# Patient Record
Sex: Male | Born: 2000
Health system: Southern US, Community
[De-identification: ages and names within clinical notes are randomized; demographics above are authoritative.]

---
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
## 2018-02-20 DIAGNOSIS — J029 Acute pharyngitis, unspecified: Secondary | ICD-10-CM | POA: Diagnosis not present

## 2018-02-20 DIAGNOSIS — H66002 Acute suppurative otitis media without spontaneous rupture of ear drum, left ear: Secondary | ICD-10-CM | POA: Diagnosis not present

## 2018-02-20 DIAGNOSIS — J0100 Acute maxillary sinusitis, unspecified: Secondary | ICD-10-CM | POA: Diagnosis not present

## 2018-04-04 DIAGNOSIS — K760 Fatty (change of) liver, not elsewhere classified: Secondary | ICD-10-CM | POA: Diagnosis not present

## 2018-04-04 DIAGNOSIS — Z79899 Other long term (current) drug therapy: Secondary | ICD-10-CM | POA: Diagnosis not present

## 2018-04-04 DIAGNOSIS — Z7722 Contact with and (suspected) exposure to environmental tobacco smoke (acute) (chronic): Secondary | ICD-10-CM | POA: Diagnosis not present

## 2018-04-04 DIAGNOSIS — K353 Acute appendicitis with localized peritonitis, without perforation or gangrene: Secondary | ICD-10-CM | POA: Diagnosis not present

## 2018-04-04 DIAGNOSIS — F909 Attention-deficit hyperactivity disorder, unspecified type: Secondary | ICD-10-CM | POA: Diagnosis not present

## 2018-04-04 DIAGNOSIS — D72829 Elevated white blood cell count, unspecified: Secondary | ICD-10-CM | POA: Diagnosis not present

## 2018-04-04 DIAGNOSIS — K3580 Unspecified acute appendicitis: Secondary | ICD-10-CM | POA: Diagnosis not present

## 2018-04-04 DIAGNOSIS — Z791 Long term (current) use of non-steroidal anti-inflammatories (NSAID): Secondary | ICD-10-CM | POA: Diagnosis not present

## 2018-04-04 DIAGNOSIS — Z7952 Long term (current) use of systemic steroids: Secondary | ICD-10-CM | POA: Diagnosis not present

## 2018-04-04 DIAGNOSIS — R1031 Right lower quadrant pain: Secondary | ICD-10-CM | POA: Diagnosis not present

## 2018-04-05 DIAGNOSIS — K3580 Unspecified acute appendicitis: Secondary | ICD-10-CM | POA: Diagnosis not present

## 2018-04-05 DIAGNOSIS — K37 Unspecified appendicitis: Secondary | ICD-10-CM | POA: Diagnosis not present

## 2018-04-08 MED ORDER — GENERIC EXTERNAL MEDICATION
Status: DC
Start: ? — End: 2018-04-08

## 2018-04-08 MED ORDER — HYDROCODONE-ACETAMINOPHEN 5-325 MG PO TABS
1.00 | ORAL_TABLET | ORAL | Status: DC
Start: ? — End: 2018-04-08

## 2018-04-08 MED ORDER — LACTATED RINGERS IV SOLN
125.00 | INTRAVENOUS | Status: DC
Start: ? — End: 2018-04-08

## 2018-04-08 MED ORDER — KETOROLAC TROMETHAMINE 15 MG/ML IJ SOLN
15.00 | INTRAMUSCULAR | Status: DC
Start: 2018-04-06 — End: 2018-04-08

## 2018-04-08 MED ORDER — MORPHINE SULFATE (PF) 4 MG/ML IV SOLN
2.00 | INTRAVENOUS | Status: DC
Start: ? — End: 2018-04-08

## 2018-04-08 MED ORDER — SODIUM CHLORIDE 0.9 % IV SOLN
10.00 | INTRAVENOUS | Status: DC
Start: ? — End: 2018-04-08

## 2018-04-08 MED ORDER — DIPHENHYDRAMINE HCL 50 MG/ML IJ SOLN
25.00 | INTRAMUSCULAR | Status: DC
Start: ? — End: 2018-04-08

## 2018-04-08 MED ORDER — ENOXAPARIN SODIUM 40 MG/0.4ML ~~LOC~~ SOLN
40.00 | SUBCUTANEOUS | Status: DC
Start: 2018-04-07 — End: 2018-04-08

## 2018-04-08 MED ORDER — DIPHENHYDRAMINE HCL 25 MG PO CAPS
25.00 | ORAL_CAPSULE | ORAL | Status: DC
Start: ? — End: 2018-04-08

## 2018-04-08 MED ORDER — FAMOTIDINE 20 MG/2ML IV SOLN
20.00 | INTRAVENOUS | Status: DC
Start: 2018-04-06 — End: 2018-04-08

## 2018-05-03 DIAGNOSIS — R000 Tachycardia, unspecified: Secondary | ICD-10-CM | POA: Diagnosis not present

## 2018-05-03 DIAGNOSIS — S0081XA Abrasion of other part of head, initial encounter: Secondary | ICD-10-CM | POA: Diagnosis not present

## 2018-05-03 DIAGNOSIS — Z041 Encounter for examination and observation following transport accident: Secondary | ICD-10-CM | POA: Diagnosis not present

## 2018-05-03 DIAGNOSIS — R1013 Epigastric pain: Secondary | ICD-10-CM | POA: Diagnosis not present

## 2018-05-03 DIAGNOSIS — Z23 Encounter for immunization: Secondary | ICD-10-CM | POA: Diagnosis not present

## 2018-05-03 DIAGNOSIS — M791 Myalgia, unspecified site: Secondary | ICD-10-CM | POA: Diagnosis not present

## 2018-05-03 DIAGNOSIS — R109 Unspecified abdominal pain: Secondary | ICD-10-CM | POA: Diagnosis not present

## 2018-05-03 DIAGNOSIS — Y92410 Unspecified street and highway as the place of occurrence of the external cause: Secondary | ICD-10-CM | POA: Diagnosis not present

## 2018-05-03 DIAGNOSIS — R079 Chest pain, unspecified: Secondary | ICD-10-CM | POA: Diagnosis not present

## 2018-05-03 DIAGNOSIS — M542 Cervicalgia: Secondary | ICD-10-CM | POA: Diagnosis not present

## 2018-05-03 DIAGNOSIS — M545 Low back pain: Secondary | ICD-10-CM | POA: Diagnosis not present

## 2018-05-03 DIAGNOSIS — M25522 Pain in left elbow: Secondary | ICD-10-CM | POA: Diagnosis not present

## 2018-05-03 DIAGNOSIS — Y999 Unspecified external cause status: Secondary | ICD-10-CM | POA: Diagnosis not present

## 2018-05-05 ENCOUNTER — Encounter: Payer: Self-pay | Admitting: Sports Medicine

## 2018-05-05 ENCOUNTER — Ambulatory Visit (INDEPENDENT_AMBULATORY_CARE_PROVIDER_SITE_OTHER): Payer: BLUE CROSS/BLUE SHIELD

## 2018-05-05 ENCOUNTER — Ambulatory Visit (INDEPENDENT_AMBULATORY_CARE_PROVIDER_SITE_OTHER): Payer: BLUE CROSS/BLUE SHIELD | Admitting: Sports Medicine

## 2018-05-05 DIAGNOSIS — M25522 Pain in left elbow: Secondary | ICD-10-CM

## 2018-05-05 DIAGNOSIS — F419 Anxiety disorder, unspecified: Secondary | ICD-10-CM

## 2018-05-05 DIAGNOSIS — K769 Liver disease, unspecified: Secondary | ICD-10-CM | POA: Insufficient documentation

## 2018-05-05 DIAGNOSIS — S59902A Unspecified injury of left elbow, initial encounter: Secondary | ICD-10-CM | POA: Diagnosis not present

## 2018-05-05 DIAGNOSIS — F329 Major depressive disorder, single episode, unspecified: Secondary | ICD-10-CM | POA: Diagnosis not present

## 2018-05-05 DIAGNOSIS — F32A Depression, unspecified: Secondary | ICD-10-CM

## 2018-05-05 MED ORDER — TRAMADOL HCL 50 MG PO TABS: 50.0000 mg | ORAL_TABLET | Freq: Three times a day (TID) | ORAL | 0 refills | Status: DC | PRN

## 2018-05-05 MED ORDER — PREDNISONE 50 MG PO TABS: ORAL_TABLET | ORAL | 0 refills | Status: DC

## 2018-05-05 MED ORDER — ESCITALOPRAM OXALATE 10 MG PO TABS: 10.0000 mg | ORAL_TABLET | Freq: Every day | ORAL | 3 refills | Status: DC

## 2018-05-05 MED ORDER — CYCLOBENZAPRINE HCL 10 MG PO TABS
ORAL_TABLET | ORAL | 0 refills | Status: DC
Start: 2018-05-05 — End: 2022-06-06

## 2018-05-05 NOTE — Assessment & Plan Note (Signed)
Self discontinued Lexapro, restarting at 10 mg. Return in 1 month for PHQ and GAD.

## 2018-05-05 NOTE — Assessment & Plan Note (Signed)
Transaminitis to the 400s, lesion seen on CT scan suspicious for hemangiomas. Rechecking CBC, CMP. He does need a liver MRI with and without contrast.
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
## 2018-05-06 ENCOUNTER — Telehealth: Payer: Self-pay | Admitting: Sports Medicine

## 2018-05-06 LAB — COMPREHENSIVE METABOLIC PANEL
ALT: 164 U/L — ABNORMAL HIGH (ref 8–46)
AST: 50 U/L — ABNORMAL HIGH (ref 12–32)
Alkaline phosphatase (APISO): 82 U/L (ref 48–230)
CO2: 28 mmol/L (ref 20–32)
Chloride: 105 mmol/L (ref 98–110)
Creat: 0.83 mg/dL (ref 0.60–1.26)
Globulin: 2.7 g/dL (calc) (ref 2.1–3.5)
Glucose, Bld: 91 mg/dL (ref 65–99)
Total Bilirubin: 0.6 mg/dL (ref 0.2–1.1)
Total Protein: 7.1 g/dL (ref 6.3–8.2)

## 2018-05-06 LAB — CBC
HCT: 42.1 % (ref 36.0–49.0)
Hemoglobin: 14 g/dL (ref 12.0–16.9)
MCH: 28.2 pg (ref 25.0–35.0)
MCHC: 33.3 g/dL (ref 31.0–36.0)
MCV: 84.7 fL (ref 78.0–98.0)
MPV: 11 fL (ref 7.5–12.5)
Platelets: 286 10*3/uL (ref 140–400)
RBC: 4.97 10*6/uL (ref 4.10–5.70)
RDW: 14.2 % (ref 11.0–15.0)
WBC: 8.4 10*3/uL (ref 4.5–13.0)

## 2018-05-06 LAB — COMPREHENSIVE METABOLIC PANEL WITH GFR
AG Ratio: 1.6 (calc) (ref 1.0–2.5)
Albumin: 4.4 g/dL (ref 3.6–5.1)
BUN: 10 mg/dL (ref 7–20)
Calcium: 9.8 mg/dL (ref 8.9–10.4)
Potassium: 4.2 mmol/L (ref 3.8–5.1)
Sodium: 141 mmol/L (ref 135–146)

## 2018-05-06 MED ORDER — ONDANSETRON 8 MG PO TBDP: 8.0000 mg | ORAL_TABLET | Freq: Three times a day (TID) | ORAL | 3 refills | Status: AC | PRN

## 2018-05-06 NOTE — Telephone Encounter (Signed)
Zofran called in.  Is there are significant anxiety with the nausea?
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
## 2018-05-19 ENCOUNTER — Encounter: Payer: Self-pay | Admitting: Sports Medicine

## 2018-05-19 ENCOUNTER — Ambulatory Visit (INDEPENDENT_AMBULATORY_CARE_PROVIDER_SITE_OTHER): Payer: BLUE CROSS/BLUE SHIELD | Admitting: Sports Medicine

## 2018-05-19 VITALS — BP 115/77 | HR 93 | Ht 69.5 in | Wt 240.0 lb

## 2018-05-19 DIAGNOSIS — F419 Anxiety disorder, unspecified: Secondary | ICD-10-CM | POA: Diagnosis not present

## 2018-05-19 DIAGNOSIS — R0789 Other chest pain: Secondary | ICD-10-CM | POA: Diagnosis not present

## 2018-05-19 DIAGNOSIS — Z23 Encounter for immunization: Secondary | ICD-10-CM | POA: Diagnosis not present

## 2018-05-19 DIAGNOSIS — F329 Major depressive disorder, single episode, unspecified: Secondary | ICD-10-CM

## 2018-05-19 DIAGNOSIS — K769 Liver disease, unspecified: Secondary | ICD-10-CM

## 2018-05-19 DIAGNOSIS — E669 Obesity, unspecified: Secondary | ICD-10-CM

## 2018-05-19 DIAGNOSIS — F32A Depression, unspecified: Secondary | ICD-10-CM

## 2018-05-19 LAB — COMPREHENSIVE METABOLIC PANEL
AG Ratio: 1.7 (calc) (ref 1.0–2.5)
AST: 40 U/L — ABNORMAL HIGH (ref 12–32)
CO2: 29 mmol/L (ref 20–32)
Chloride: 101 mmol/L (ref 98–110)
Globulin: 2.6 g/dL (calc) (ref 2.1–3.5)
Glucose, Bld: 84 mg/dL (ref 65–99)
Sodium: 139 mmol/L (ref 135–146)
Total Protein: 7 g/dL (ref 6.3–8.2)

## 2018-05-19 LAB — COMPREHENSIVE METABOLIC PANEL WITH GFR
ALT: 78 U/L — ABNORMAL HIGH (ref 8–46)
Albumin: 4.4 g/dL (ref 3.6–5.1)
Alkaline phosphatase (APISO): 83 U/L (ref 48–230)
BUN: 12 mg/dL (ref 7–20)
Calcium: 9.9 mg/dL (ref 8.9–10.4)
Creat: 1.19 mg/dL (ref 0.60–1.26)
Potassium: 4.5 mmol/L (ref 3.8–5.1)
Total Bilirubin: 0.5 mg/dL (ref 0.2–1.1)

## 2018-05-19 MED ORDER — PREDNISONE 50 MG PO TABS: 50.0000 mg | ORAL_TABLET | Freq: Every day | ORAL | 0 refills | Status: DC

## 2018-05-19 MED ORDER — ESCITALOPRAM OXALATE 20 MG PO TABS: 20.0000 mg | ORAL_TABLET | Freq: Every day | ORAL | 3 refills | Status: DC

## 2018-05-19 MED ORDER — AZITHROMYCIN 250 MG PO TABS: ORAL_TABLET | ORAL | 0 refills | Status: DC
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
## 2018-05-19 NOTE — Assessment & Plan Note (Signed)
Chest CT in the emergency department did show some groundglass opacities suspicious for pneumonia. Considering he still having some pain with deep breathing we are going to add a repeat chest x-ray, prednisone and azithromycin.
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
## 2018-05-19 NOTE — Assessment & Plan Note (Signed)
MRI shows what appear to be hemangiomas which are benign. Rechecking LFTs. He did have hepatic steatosis, needs to lose a lot of weight.

## 2018-05-19 NOTE — Assessment & Plan Note (Signed)
Persistent pain all over, headaches, nausea, depression and anxiety is still uncontrolled. Increasing Lexapro to 20 mg.

## 2018-05-19 NOTE — Assessment & Plan Note (Signed)
We will restart phentermine at the next visit.

## 2018-05-21 ENCOUNTER — Ambulatory Visit (INDEPENDENT_AMBULATORY_CARE_PROVIDER_SITE_OTHER): Payer: BLUE CROSS/BLUE SHIELD

## 2018-05-21 DIAGNOSIS — R079 Chest pain, unspecified: Secondary | ICD-10-CM

## 2018-05-21 DIAGNOSIS — R0789 Other chest pain: Secondary | ICD-10-CM

## 2018-06-09 ENCOUNTER — Ambulatory Visit (INDEPENDENT_AMBULATORY_CARE_PROVIDER_SITE_OTHER): Payer: BLUE CROSS/BLUE SHIELD | Admitting: Sports Medicine

## 2018-06-09 ENCOUNTER — Encounter: Payer: Self-pay | Admitting: Sports Medicine

## 2018-06-09 DIAGNOSIS — F419 Anxiety disorder, unspecified: Secondary | ICD-10-CM | POA: Diagnosis not present

## 2018-06-09 DIAGNOSIS — E669 Obesity, unspecified: Secondary | ICD-10-CM

## 2018-06-09 DIAGNOSIS — F329 Major depressive disorder, single episode, unspecified: Secondary | ICD-10-CM

## 2018-06-09 DIAGNOSIS — F32A Depression, unspecified: Secondary | ICD-10-CM

## 2018-06-09 DIAGNOSIS — R0789 Other chest pain: Secondary | ICD-10-CM | POA: Diagnosis not present

## 2018-06-09 MED ORDER — PHENTERMINE HCL 37.5 MG PO TABS: ORAL_TABLET | ORAL | 0 refills | Status: DC
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
## 2018-06-09 NOTE — Assessment & Plan Note (Signed)
Only minimal upper abdominal/lower chest wall pain, we did prednisone and azithromycin at the last visit as his CT did have some groundglass opacities. Symptoms are mostly gone, chest x-ray at the last visit was clear, no further imaging needed.

## 2018-06-09 NOTE — Assessment & Plan Note (Signed)
Symptoms not significantly improved on 20 mg of Lexapro. Adding behavioral therapy. We are going to be treating his weight with phentermine so I am not going to add Abilify at this time but if persistent depressive symptoms and no improvement in scores at the next visit we will add Abilify 5.

## 2018-06-09 NOTE — Assessment & Plan Note (Signed)
With hepatic steatosis and transaminitis. Restarting phentermine, return monthly for weight checks and refills.

## 2018-06-16 ENCOUNTER — Ambulatory Visit: Payer: BLUE CROSS/BLUE SHIELD | Admitting: Sports Medicine

## 2018-07-07 ENCOUNTER — Ambulatory Visit (INDEPENDENT_AMBULATORY_CARE_PROVIDER_SITE_OTHER): Payer: BLUE CROSS/BLUE SHIELD | Admitting: Sports Medicine

## 2018-07-07 ENCOUNTER — Encounter: Payer: Self-pay | Admitting: Sports Medicine

## 2018-07-07 DIAGNOSIS — F419 Anxiety disorder, unspecified: Secondary | ICD-10-CM

## 2018-07-07 DIAGNOSIS — F329 Major depressive disorder, single episode, unspecified: Secondary | ICD-10-CM

## 2018-07-07 DIAGNOSIS — F32A Depression, unspecified: Secondary | ICD-10-CM

## 2018-07-07 DIAGNOSIS — E669 Obesity, unspecified: Secondary | ICD-10-CM | POA: Diagnosis not present

## 2018-07-07 MED ORDER — PHENTERMINE HCL 37.5 MG PO TABS: ORAL_TABLET | ORAL | 0 refills | Status: DC

## 2018-07-07 NOTE — Assessment & Plan Note (Signed)
For the most part noncompliant with phentermine over the past month. He does have hepatic steatosis and transaminitis. Refilling phentermine, entering the second month, he will take it consistently, with a target of 5 to 7 pounds weight loss over the next month. No adverse effects or intolerance to phentermine.

## 2018-07-07 NOTE — Assessment & Plan Note (Signed)
Slight improvement with the addition of phentermine to the regimen. He is also working now. Continue Lexapro 20. We may add Topamax at the next visit should depressive symptoms still be severe and/or weight be plateaued. Abilify always is an option as well.

## 2018-07-07 NOTE — Progress Notes (Signed)
Subjective:    CC: Follow-up  HPI: Obesity: Only a 3 pound weight loss but has been only minimally compliant with the phentermine.  No adverse effects.  I reviewed the past medical history, family history, social history, surgical history, and allergies today and no changes were needed.  Please see the problem list section below in epic for further details.  Past Medical History: No past medical history on file. Past Surgical History: Past Surgical History:  Procedure Laterality Date  . TONSILLECTOMY AND ADENOIDECTOMY  2005   Social History: Social History   Socioeconomic History  . Marital status: Single    Spouse name: Not on file  . Number of children: Not on file  . Years of education: Not on file  . Highest education level: Not on file  Occupational History  . Not on file  Social Needs  . Financial resource strain: Not on file  . Food insecurity:    Worry: Not on file    Inability: Not on file  . Transportation needs:    Medical: Not on file    Non-medical: Not on file  Tobacco Use  . Smoking status: Never Smoker  . Smokeless tobacco: Never Used  Substance and Sexual Activity  . Alcohol use: No  . Drug use: No  . Sexual activity: Never  Lifestyle  . Physical activity:    Days per week: Not on file    Minutes per session: Not on file  . Stress: Not on file  Relationships  . Social connections:    Talks on phone: Not on file    Gets together: Not on file    Attends religious service: Not on file    Active member of club or organization: Not on file    Attends meetings of clubs or organizations: Not on file    Relationship status: Not on file  Other Topics Concern  . Not on file  Social History Narrative  . Not on file   Family History: Family History  Problem Relation Age of Onset  . Depression Mother   . Hypertension Father   . Cancer Maternal Aunt        colon  . Diabetes Paternal Uncle   . Hyperlipidemia Maternal Grandmother   .  Hypertension Maternal Grandmother   . Hyperlipidemia Maternal Grandfather   . Diabetes Paternal Grandmother    Allergies: No Known Allergies Medications: See med rec.  Review of Systems: No fevers, chills, night sweats, weight loss, chest pain, or shortness of breath.   Objective:    General: Well Developed, well nourished, and in no acute distress.  Neuro: Alert and oriented x3, extra-ocular muscles intact, sensation grossly intact.  HEENT: Normocephalic, atraumatic, pupils equal round reactive to light, neck supple, no masses, no lymphadenopathy, thyroid nonpalpable.  Skin: Warm and dry, no rashes. Cardiac: Regular rate and rhythm, no murmurs rubs or gallops, no lower extremity edema.  Respiratory: Clear to auscultation bilaterally. Not using accessory muscles, speaking in full sentences.  Impression and Recommendations:    Anxiety and depression Slight improvement with the addition of phentermine to the regimen. He is also working now. Continue Lexapro 20. We may add Topamax at the next visit should depressive symptoms still be severe and/or weight be plateaued. Abilify always is an option as well.  Obesity (BMI 30-39.9) For the most part noncompliant with phentermine over the past month. He does have hepatic steatosis and transaminitis. Refilling phentermine, entering the second month, he will take it consistently, with a  target of 5 to 7 pounds weight loss over the next month. No adverse effects or intolerance to phentermine. ___________________________________________ Ihor Austin. Benjamin Stain, M.D., ABFM., CAQSM. Primary Care and Sports Medicine [facility]  Adjunct Professor of Family Medicine  [employer] [facility]

## 2018-08-07 ENCOUNTER — Ambulatory Visit: Payer: BLUE CROSS/BLUE SHIELD | Admitting: Sports Medicine

## 2018-08-10 ENCOUNTER — Other Ambulatory Visit: Payer: Self-pay | Admitting: Sports Medicine

## 2018-08-10 DIAGNOSIS — F329 Major depressive disorder, single episode, unspecified: Secondary | ICD-10-CM

## 2018-08-10 DIAGNOSIS — F419 Anxiety disorder, unspecified: Principal | ICD-10-CM

## 2018-10-30 ENCOUNTER — Ambulatory Visit: Payer: BLUE CROSS/BLUE SHIELD | Admitting: Sports Medicine

## 2018-12-02 DIAGNOSIS — M545 Low back pain: Secondary | ICD-10-CM | POA: Diagnosis not present

## 2019-01-12 ENCOUNTER — Ambulatory Visit: Payer: BLUE CROSS/BLUE SHIELD | Admitting: Sports Medicine
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
## 2020-09-01 ENCOUNTER — Ambulatory Visit: Payer: Self-pay | Admitting: Sports Medicine

## 2020-09-12 DIAGNOSIS — Z20822 Contact with and (suspected) exposure to covid-19: Secondary | ICD-10-CM | POA: Diagnosis not present

## 2022-06-06 ENCOUNTER — Encounter: Payer: Self-pay | Admitting: Sports Medicine

## 2022-06-06 ENCOUNTER — Ambulatory Visit (INDEPENDENT_AMBULATORY_CARE_PROVIDER_SITE_OTHER): Payer: BC Managed Care – PPO | Admitting: Sports Medicine

## 2022-06-06 DIAGNOSIS — E669 Obesity, unspecified: Secondary | ICD-10-CM | POA: Diagnosis not present

## 2022-06-06 DIAGNOSIS — Z0000 Encounter for general adult medical examination without abnormal findings: Secondary | ICD-10-CM

## 2022-06-06 DIAGNOSIS — F5101 Primary insomnia: Secondary | ICD-10-CM

## 2022-06-06 MED ORDER — TRAZODONE HCL 50 MG PO TABS: ORAL_TABLET | ORAL | 3 refills | Status: AC

## 2022-06-06 MED ORDER — WEGOVY 0.25 MG/0.5ML ~~LOC~~ SOAJ: 0.2500 mg | SUBCUTANEOUS | 0 refills | Status: DC

## 2022-06-06 NOTE — Progress Notes (Signed)
    Procedures performed today:    None.  Independent interpretation of notes and tests performed by another provider:   None.  Brief History, Exam, Impression, and Recommendations:    Primary insomnia Yechezkel returns, he is a very pleasant 21 year old male, he is dealing with some primary insomnia, sleep latency for several hours. His sleep hygiene could use a bit of work, we discussed decreasing caffeinated beverages at dinner, we also discussed turning on the bluelight filter and decreasing the brightness of his cell phone before bed. We will also add trazodone in an up taper. Return to see me in 6 weeks.  Obesity (BMI 30-39.9) Keymon has done great losing over 20 pounds on his own, he has had a plateau, he will be part of a multidisciplinary weight loss program, with calorie counting, exercise prescription, starting PPIRJJ.  Annual physical exam After we get him stabilized with sleep and weight loss we will do an annual physical and check some routine labs.  Chronic process with exacerbation and pharmacologic intervention  ____________________________________________ Ihor Austin. Benjamin Stain, M.D., ABFM., CAQSM., AME. Primary Care and Sports Medicine [facility]  Adjunct Professor of Family Medicine  [place] [employer] [facility] [employer] [facility]  Restaurant manager, fast food

## 2022-06-06 NOTE — Assessment & Plan Note (Signed)
After we get him stabilized with sleep and weight loss we will do an annual physical and check some routine labs.

## 2022-06-06 NOTE — Assessment & Plan Note (Signed)
Riley Alvarez returns, he is a very pleasant 21 year old male, he is dealing with some primary insomnia, sleep latency for several hours. His sleep hygiene could use a bit of work, we discussed decreasing caffeinated beverages at dinner, we also discussed turning on the bluelight filter and decreasing the brightness of his cell phone before bed. We will also add trazodone in an up taper. Return to see me in 6 weeks.

## 2022-06-06 NOTE — Assessment & Plan Note (Signed)
Ronel has done great losing over 20 pounds on his own, he has had a plateau, he will be part of a multidisciplinary weight loss program, with calorie counting, exercise prescription, starting VOHYWV.

## 2022-06-21 ENCOUNTER — Telehealth: Payer: Self-pay | Admitting: Sports Medicine

## 2022-06-21 NOTE — Telephone Encounter (Signed)
Patient called and stated that Mancel Parsons is unavailable and wishes to seek help from PCP in attaining it. Please advise. Buel Ream

## 2022-06-22 NOTE — Telephone Encounter (Signed)
Called and notified mother of PCP's response. Riley Alvarez

## 2022-06-22 NOTE — Telephone Encounter (Signed)
My only advice is to call around to different pharmacies until he finds 1 that has it.  The other option is to start with the lower doses with the generic Wegovy that is $180 a month

## 2022-06-30 ENCOUNTER — Other Ambulatory Visit: Payer: Self-pay | Admitting: Sports Medicine

## 2022-06-30 DIAGNOSIS — F5101 Primary insomnia: Secondary | ICD-10-CM

## 2022-07-18 ENCOUNTER — Ambulatory Visit: Payer: BC Managed Care – PPO | Admitting: Sports Medicine

## 2022-07-19 ENCOUNTER — Ambulatory Visit: Payer: BC Managed Care – PPO | Admitting: Sports Medicine

## 2022-08-07 ENCOUNTER — Other Ambulatory Visit: Payer: Self-pay | Admitting: Sports Medicine

## 2022-08-07 DIAGNOSIS — F5101 Primary insomnia: Secondary | ICD-10-CM

## 2022-08-08 ENCOUNTER — Other Ambulatory Visit: Payer: Self-pay | Admitting: Sports Medicine

## 2022-08-08 DIAGNOSIS — F5101 Primary insomnia: Secondary | ICD-10-CM

## 2023-03-08 ENCOUNTER — Encounter: Payer: Self-pay | Admitting: Sports Medicine

## 2023-03-08 DIAGNOSIS — E669 Obesity, unspecified: Secondary | ICD-10-CM

## 2023-03-11 MED ORDER — SAXENDA 18 MG/3ML ~~LOC~~ SOPN: 3.0000 mg | PEN_INJECTOR | Freq: Every day | SUBCUTANEOUS | 11 refills | Status: AC

## 2023-03-11 NOTE — Telephone Encounter (Signed)
I spent 5 total minutes of online digital evaluation and management services in this patient-initiated request for online care. 

## 2023-08-06 ENCOUNTER — Ambulatory Visit: Payer: BC Managed Care – PPO

## 2023-08-06 ENCOUNTER — Ambulatory Visit (INDEPENDENT_AMBULATORY_CARE_PROVIDER_SITE_OTHER): Payer: BC Managed Care – PPO | Admitting: Sports Medicine

## 2023-08-06 ENCOUNTER — Encounter: Payer: Self-pay | Admitting: Sports Medicine

## 2023-08-06 DIAGNOSIS — M5416 Radiculopathy, lumbar region: Secondary | ICD-10-CM | POA: Insufficient documentation

## 2023-08-06 DIAGNOSIS — M545 Low back pain, unspecified: Secondary | ICD-10-CM

## 2023-08-06 MED ORDER — PREDNISONE 50 MG PO TABS: ORAL_TABLET | ORAL | 0 refills | Status: DC

## 2023-08-06 NOTE — Assessment & Plan Note (Signed)
This is a very pleasant 22 year old male, he has had about 6 weeks of pain left-sided low back radiation down the back of the left leg, left lower leg and Achilles but not to the foot. Worse with sitting, flexion, Valsalva, no progressive weakness. His exam is normal with the exception of a straight leg raise positive on the left, reflexes are good. No red flag symptoms or signs. 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 We discussed the evolutionary anthropology and anatomy of lumbar disc disease. Return to see me in 6 weeks, MR for interventional planning if not better.

## 2023-08-06 NOTE — Progress Notes (Signed)
Okay   Procedures performed today:    None.  Independent interpretation of notes and tests performed by another provider:   None.  Brief History, Exam, Impression, and Recommendations:    Left lumbar radiculopathy This is a very pleasant 22 year old male, he has had about 6 weeks of pain left-sided low back radiation down the back of the left leg, left lower leg and Achilles but not to the foot. Worse with sitting, flexion, Valsalva, no progressive weakness. His exam is normal with the exception of a straight leg raise positive on the left, reflexes are good. No red flag symptoms or signs. 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 We discussed the evolutionary anthropology and anatomy of lumbar disc disease. Return to see me in 6 weeks, MR for interventional planning if not better.    ____________________________________________ Ihor Austin. Benjamin Stain, M.D., ABFM., CAQSM., AME. Primary Care and Sports Medicine [place] [facility]  Adjunct Professor of Family Medicine  [place] [employer] [facility] [employer] [facility] [employer]  Restaurant manager, fast food

## 2023-08-07 NOTE — Therapy (Unsigned)
OUTPATIENT PHYSICAL THERAPY THORACOLUMBAR EVALUATION   Patient Name: Riley Alvarez. MRN: [id number] DOB:09-01-2001, 22 y.o., male Today's Date: 08/08/2023  END OF SESSION:  PT End of Session - 08/08/23 1305     Visit Number 1    Number of Visits 24    Date for PT Re-Evaluation 10/31/23    Authorization Type [employer] bcbs 85/15 no copay    PT Start Time 1145    PT Stop Time 1235    PT Time Calculation (min) 50 min    Activity Tolerance Patient tolerated treatment well             History reviewed. No pertinent past medical history. Past Surgical History:  Procedure Laterality Date   TONSILLECTOMY AND ADENOIDECTOMY  2005   Patient Active Problem List   Diagnosis Date Noted   Left lumbar radiculopathy 08/06/2023   Lesion of liver 05/05/2018   Hyperlipidemia, mixed 11/21/2016   Annual physical exam 11/20/2016   Obesity (BMI 30-39.9) 11/20/2016   Primary insomnia 11/20/2016    PCP: Dr Rodney Langton  REFERRING PROVIDER: Dr Rodney Langton  REFERRING DIAG: L lumbar radiculopathy   Rationale for Evaluation and Treatment: Rehabilitation  THERAPY DIAG:  Left lumbar radiculopathy  Other symptoms and signs involving the musculoskeletal system  Muscle weakness (generalized)  ONSET DATE: 06/25/23  SUBJECTIVE:                                                                                                                                                                                           SUBJECTIVE STATEMENT: Patient reports 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 The L hip pain is constant. Symptoms have increased over the past 6 weeks. Symptoms are worse as the day progresses.   PERTINENT HISTORY:  Appendectomy; otherwise unremarkable   PAIN:  Are you having pain? Yes: NPRS scale:  6-7/10 Pain location: L posterior hip  Pain description: sharp; constant  Aggravating factors: weight bearing; bending over; sitting; life  Relieving factors: nothing  PRECAUTIONS: None  RED FLAGS: None   WEIGHT BEARING RESTRICTIONS: No  FALLS:  Has patient fallen in last 6 months? 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  DIAGNOSTIC FINDINGS:  Xray 08/06/23 - results not available   PATIENT SURVEYS:  FOTO 40; goal 67  SCREENING FOR RED FLAGS: Bowel or bladder incontinence: No Spinal tumors: No Cauda equina syndrome: No Compression fracture: No Abdominal aneurysm: No  COGNITION: Overall cognitive status: Within functional limits for tasks assessed     SENSATION: Posterior thigh on an intermittent basis   MUSCLE LENGTH: Hamstrings: Right 60 deg; Left 55 deg   POSTURE: rounded shoulders, forward head, and flexed trunk   PALPATION: muscular tightness L > R lumbar paraspinals, QL, piriformis, gluts, hip flexors  LUMBAR ROM:   AROM eval  Flexion 85% pain L LB  Extension 35%  Right lateral flexion 90% sharp pain L side   Left lateral flexion 95%  Right rotation 75% tight L LB  Left rotation 70% pain L LB    (Blank rows = not tested)  LOWER EXTREMITY ROM:   pain with resisted testing   Active  Right eval Left eval  Hip flexion 5 5-  Hip extension 5 4  Hip abduction 5 4  Hip adduction    Hip internal rotation    Hip external rotation    Knee flexion    Knee extension    Ankle dorsiflexion    Ankle plantarflexion    Ankle inversion    Ankle eversion     (Blank rows = not  tested)  LOWER EXTREMITY MMT:    MMT Right eval Left eval  Hip flexion    Hip extension tight tight  Hip abduction    Hip adduction    Hip internal rotation tight tight  Hip external rotation    Knee flexion    Knee extension    Ankle dorsiflexion    Ankle plantarflexion    Ankle inversion    Ankle eversion     (Blank rows = not tested)  LUMBAR SPECIAL TESTS:  Straight leg raise test: Negative and Slump test: Negative  FUNCTIONAL TESTS:  5 times sit to stand: 17.68 sec pain with sit to stand mvt  SLS - 10 sec R/L   GAIT: Distance walked: 40 ft  Assistive device utilized: None Level of assistance: Complete Independence Comments: antalgic gait with limp L LE in wt bearing L   [facility] Adult PT Treatment:                                                DATE: 08/08/23 Therapeutic Exercise: Standing  Trunk extension to tolerance 2 reps 1-2 sec  Prone  Partial press up to tolerance 2 sec x 5 (some discomfort R wrist prior symptoms) Prone prop ~ 2 min pillow under chest  Supine  Diaphragmatic breathing count of 6  4 part core transverse abdominals  Piriformis stretch 30 sec x 2 Hamstring stretch 30 sec x 2 Hip flexor stretch 30 sec x 2 Sciatic nerve glide 1-2 sec x 10  Manual Therapy: Add trial of manual therapy lumbar spine and L posterior hip; consider trial of DN L posterior hip  Modalities: Consider trial of TENS for pain management  Self Care: Discussed sitting in supported posture avoiding rounded posture and soft surfaces     PATIENT EDUCATION:  Education details: POC; HEP  Person educated: Patient Education method: Explanation, Demonstration, Tactile cues, Verbal cues, and Handouts Education comprehension: verbalized understanding, returned demonstration, verbal cues required, tactile cues required, and needs further education  HOME EXERCISE PROGRAM: Access Code: [email or web address] URL: [email or web address] Date: 08/08/2023 Prepared by: Corlis Leak  Exercises - Prone Press Up  - 2 x daily - 7 x weekly - 1 sets - 10 reps - 2-3 sec  hold - Prone Press Up On Elbows  - 2 x daily - 7 x weekly - 1 sets - 3 reps - 30 sec  hold - Standing Lumbar Extension  - 2 x daily - 7 x weekly - 1 sets - 2-3 reps - 2-3 sec  hold - Supine Transversus Abdominis Bracing with Pelvic Floor Contraction  - 2 x daily - 7 x weekly - 1 sets - 10 reps - 10sec  hold - Supine Diaphragmatic Breathing  - 2 x daily - 7 x weekly - 1 sets - 10 reps - 4-6 sec  hold - Supine Piriformis Stretch with Leg Straight  - 2 x daily - 7 x weekly - 1 sets - 3 reps - 30 sec  hold - Hooklying Hamstring Stretch with Strap  - 2 x daily - 7 x weekly - 1 sets - 3 reps - 30 sec  hold - Supine Sciatic Nerve Glide  - 2 x daily - 7 x weekly - 1 sets - 8-10 reps - 1-2 sec  hold - Hip Flexor Stretch at Edge of Bed  - 2 x daily - 7 x weekly - 1 sets - 3 reps - 30 sec  hold  Patient Education - Hospital doctor - Trigger Point Dry Needling - TENS Unit  ASSESSMENT:  CLINICAL IMPRESSION: Patient is a 22 y.o. male who was seen today for physical therapy evaluation and treatment of 6 week history of L lumbar radiculopathy. He has poor posture and alignment; limited trunk and LE mobility/ROM; muscular tightness L > R lumbar paraspinals, QL, piriformis, gluts, hip flexors; pain limiting functional activities; inability to work. Patient will benefit from PT to address problems identified.    OBJECTIVE IMPAIRMENTS: Abnormal gait, decreased mobility, decreased ROM, decreased strength, increased fascial restrictions, impaired flexibility, improper body mechanics, postural dysfunction, and pain.   ACTIVITY LIMITATIONS: carrying, lifting, bending, sitting, standing, sleeping, stairs, and locomotion level  PARTICIPATION LIMITATIONS: driving, community activity, occupation, and bowling  PERSONAL FACTORS: Behavior pattern, Fitness, Profession, and Time since onset of injury/illness/exacerbation  are also affecting patient's functional outcome.   REHAB POTENTIAL: Good  CLINICAL DECISION MAKING: Stable/uncomplicated  EVALUATION COMPLEXITY: Low   GOALS: Goals reviewed with patient? Yes  SHORT TERM GOALS: Target date: 09/19/2023   Independent in initial HEP  Baseline: Goal status: INITIAL  2.  Improve FOTO to 52 Baseline:  Goal status: INITIAL   LONG TERM GOALS: Target date: 10/31/2023  Decrease pain LB and L LE by 75-100%  Baseline:  Goal status: INITIAL  2.  Full pain free lumbar ROM  Baseline:  Goal status: INITIAL  3.  5/5 strength L LE  Baseline:  Goal status: INITIAL  4.  Improve core strength and stability with patient to demonstrate proper lifting and carrying techniques with core engaged  Baseline:  Goal status: INITIAL  5.  Independent in HEP, including aquatic program as indicated  Baseline:  Goal status: INITIAL  6.  Improve FOTO to 67 Baseline: 40 Goal status: INITIAL  PLAN:  PT FREQUENCY: 2x/week  PT DURATION: 12 weeks  PLANNED INTERVENTIONS: 97164- PT Re-evaluation, 97110-Therapeutic exercises, 97530- Therapeutic activity, [id number]- Neuromuscular re-education, 97535- Self Care, 16109- Manual therapy, [id number]- Gait training, [phone]- Aquatic Therapy, 97014- Electrical stimulation (unattended), [id number]- Ultrasound, [id number]- Traction (mechanical), Patient/Family education, Dry Needling, Joint mobilization, Spinal mobilization, Cryotherapy, and Moist heat.  PLAN FOR NEXT SESSION: review and progress exercises; back care education; postural correction and education; manual work, DN, modalities as indicated    [employer], PT 08/08/2023, 1:06 PM

## 2023-08-08 ENCOUNTER — Encounter: Payer: Self-pay | Admitting: Rehabilitative and Restorative Service Providers"

## 2023-08-08 ENCOUNTER — Other Ambulatory Visit: Payer: Self-pay

## 2023-08-08 ENCOUNTER — Ambulatory Visit: Payer: BC Managed Care – PPO | Attending: Sports Medicine | Admitting: Rehabilitative and Restorative Service Providers"

## 2023-08-08 DIAGNOSIS — M5416 Radiculopathy, lumbar region: Secondary | ICD-10-CM | POA: Insufficient documentation

## 2023-08-08 DIAGNOSIS — M6281 Muscle weakness (generalized): Secondary | ICD-10-CM | POA: Insufficient documentation

## 2023-08-08 DIAGNOSIS — R29898 Other symptoms and signs involving the musculoskeletal system: Secondary | ICD-10-CM | POA: Diagnosis not present

## 2023-08-13 ENCOUNTER — Ambulatory Visit: Payer: BC Managed Care – PPO

## 2023-08-15 ENCOUNTER — Ambulatory Visit: Payer: BC Managed Care – PPO

## 2023-08-15 DIAGNOSIS — R29898 Other symptoms and signs involving the musculoskeletal system: Secondary | ICD-10-CM

## 2023-08-15 DIAGNOSIS — M6281 Muscle weakness (generalized): Secondary | ICD-10-CM

## 2023-08-15 DIAGNOSIS — M5416 Radiculopathy, lumbar region: Secondary | ICD-10-CM

## 2023-08-15 NOTE — Therapy (Signed)
OUTPATIENT PHYSICAL THERAPY THORACOLUMBAR TREATMENT  Patient Name: Riley Alvarez. MRN: [id number] DOB:11/03/2000, 22 y.o., male Today's Date: 08/15/2023  END OF SESSION:  PT End of Session - 08/15/23 1031     Visit Number 2    Number of Visits 24    Date for PT Re-Evaluation 10/31/23    Authorization Type [employer] bcbs 85/15 no copay    PT Start Time 0850    PT Stop Time 0930    PT Time Calculation (min) 40 min    Activity Tolerance Patient tolerated treatment well             History reviewed. No pertinent past medical history. Past Surgical History:  Procedure Laterality Date   TONSILLECTOMY AND ADENOIDECTOMY  2005   Patient Active Problem List   Diagnosis Date Noted   Left lumbar radiculopathy 08/06/2023   Lesion of liver 05/05/2018   Hyperlipidemia, mixed 11/21/2016   Annual physical exam 11/20/2016   Obesity (BMI 30-39.9) 11/20/2016   Primary insomnia 11/20/2016    PCP: Dr Rodney Langton  REFERRING PROVIDER: Dr Rodney Langton  REFERRING DIAG: L lumbar radiculopathy   Rationale for Evaluation and Treatment: Rehabilitation  THERAPY DIAG:  Left lumbar radiculopathy  Other symptoms and signs involving the musculoskeletal system  Muscle weakness (generalized)  ONSET DATE: 06/25/23  SUBJECTIVE:                                                                                                                                                                                           SUBJECTIVE STATEMENT: Patient returns to the clinic stating he is still experiencing L sided low back and L lower extremity pain to a decent extent, but the intensity has lessened to where it is more tolerable. His home program seems to be helpful. Patient is opening to trialing dry needling today - his father has had success for his low back pain with this technique.  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 The L hip pain is constant. Symptoms have increased over the past 6 weeks. Symptoms are worse as the day progresses.   PERTINENT HISTORY:  Appendectomy; otherwise unremarkable   PAIN:  Are you having pain? Yes: NPRS scale: 6-7/10 Pain location: L posterior hip  Pain description: sharp; constant  Aggravating factors: weight bearing; bending over; sitting; life  Relieving factors: nothing  PRECAUTIONS: None  RED FLAGS: None  WEIGHT BEARING RESTRICTIONS: No  FALLS:  Has patient fallen in last 6 months? 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  DIAGNOSTIC FINDINGS:  Xray 08/06/23 - results not available   PATIENT SURVEYS:  FOTO 40; goal 67  SCREENING FOR RED FLAGS: Bowel or bladder incontinence: No Spinal tumors: No Cauda equina syndrome: No Compression fracture: No Abdominal aneurysm: No  COGNITION: Overall cognitive status: Within functional limits for tasks assessed     SENSATION: Posterior thigh on an intermittent basis   MUSCLE LENGTH: Hamstrings: Right 60 deg; Left 55 deg   POSTURE: rounded shoulders, forward head, and flexed trunk   PALPATION: muscular tightness L > R lumbar paraspinals, QL, piriformis, gluts, hip flexors  LUMBAR ROM:   AROM eval  Flexion 85% pain L LB  Extension 35%  Right lateral flexion 90% sharp pain L side   Left lateral flexion 95%  Right rotation 75% tight L LB  Left rotation 70% pain L LB    (Blank rows = not  tested)  LOWER EXTREMITY ROM:   pain with resisted testing   Active  Right eval Left eval  Hip flexion 5 5-  Hip extension 5 4  Hip abduction 5 4  Hip adduction    Hip internal rotation    Hip external rotation    Knee flexion    Knee extension    Ankle dorsiflexion    Ankle plantarflexion    Ankle inversion    Ankle eversion     (Blank rows = not tested)  LOWER EXTREMITY MMT:    MMT Right eval Left eval  Hip flexion    Hip extension tight tight  Hip abduction    Hip adduction    Hip internal rotation tight tight  Hip external rotation    Knee flexion    Knee extension    Ankle dorsiflexion    Ankle plantarflexion    Ankle inversion    Ankle eversion     (Blank rows = not tested)  LUMBAR SPECIAL TESTS:  Straight leg raise test: Negative and Slump test: Negative  FUNCTIONAL TESTS:  5 times sit to stand: 17.68 sec pain with sit to stand mvt  SLS - 10 sec R/L   GAIT: Distance walked: 40 ft  Assistive device utilized: None Level of assistance: Complete Independence Comments: antalgic gait with limp L LE in wt bearing L   [facility] Adult PT Treatment:                                                DATE: 08/15/2023 Trigger Point Dry-Needling  Treatment instructions: Expect mild to moderate muscle soreness. S/S of pneumothorax if dry needled over a lung field, and to seek immediate medical attention should they occur. Patient verbalized understanding of these instructions and education. Patient Consent Given: Yes Education handout provided: No Muscles treated: L lumbar paraspinals, L gluteus medius in R sidelying Electrical stimulation performed: Yes Parameters:  Low frequency x 10 minutes ; muscular  contraction achieved at both areas Treatment response/outcome: reduced L hip pain upon standing Manual Therapy: R sidelying: Skilled assessment for taut bands within the L lumbar paraspinals and L gluteus medius Soft tissue mobilization to L lumbar paraspinals and L  gluteus medius post dry needling  [facility] Adult PT Treatment:                                                DATE: 08/08/23 Therapeutic Exercise: Standing  Trunk extension to tolerance 2 reps 1-2 sec  Prone  Partial press up to tolerance 2 sec x 5 (some discomfort R wrist prior symptoms) Prone prop ~ 2 min pillow under chest  Supine  Diaphragmatic breathing count of 6  4 part core transverse abdominals  Piriformis stretch 30 sec x 2 Hamstring stretch 30 sec x 2 Hip flexor stretch 30 sec x 2 Sciatic nerve glide 1-2 sec x 10  Manual Therapy: Add trial of manual therapy lumbar spine and L posterior hip; consider trial of DN L posterior hip  Modalities: Consider trial of TENS for pain management  Self Care: Discussed sitting in supported posture avoiding rounded posture and soft surfaces   PATIENT EDUCATION:  Education details: POC; HEP  Person educated: Patient Education method: Explanation, Demonstration, Tactile cues, Verbal cues, and Handouts Education comprehension: verbalized understanding, returned demonstration, verbal cues required, tactile cues required, and needs further education  HOME EXERCISE PROGRAM: Access Code: [email or web address] URL: [email or web address] Date: 08/08/2023 Prepared by: Corlis Leak  Exercises - Prone Press Up  - 2 x daily - 7 x weekly - 1 sets - 10 reps - 2-3 sec  hold - Prone Press Up On Elbows  - 2 x daily - 7 x weekly - 1 sets - 3 reps - 30 sec  hold - Standing Lumbar Extension  - 2 x daily - 7 x weekly - 1 sets - 2-3 reps - 2-3 sec  hold - Supine Transversus Abdominis Bracing with Pelvic Floor Contraction  - 2 x daily - 7 x weekly - 1 sets - 10 reps - 10sec  hold - Supine Diaphragmatic Breathing  - 2 x daily - 7 x weekly - 1 sets - 10 reps - 4-6 sec  hold - Supine Piriformis Stretch with Leg Straight  - 2 x daily - 7 x weekly - 1 sets - 3 reps - 30 sec  hold - Hooklying Hamstring Stretch with Strap  - 2 x daily - 7 x weekly - 1 sets - 3 reps  - 30 sec  hold - Supine Sciatic Nerve Glide  - 2 x daily - 7 x weekly - 1 sets - 8-10 reps - 1-2 sec  hold - Hip Flexor Stretch at Edge of Bed  - 2 x daily - 7 x weekly - 1 sets - 3 reps - 30 sec  hold  Patient Education - Hospital doctor - Trigger Point Dry Needling - TENS Unit  ASSESSMENT:  CLINICAL IMPRESSION: Trialed dry needling today to the L lumbar paraspinals and L gluteus medius with low frequency electrical stimulation. The patient tolerated the treatment well, some pain with needle insertion which resolved as needles remained in situ. No adverse response noted following treatment and patient reported some immediate reduction in L hip pain upon standing. Patient will continue to monitor symptoms over next 48 hours to determine if  he thinks dry needling was helpful, and the intervention can be repeated at future sessions should benefit be noted. He will also continue with his home program. Did not follow up with therapeutic exercise as to not potentially aggravate symptoms after treatment so patient can determine the effect of dry needling alone. Physical therapy remains indicated.   From evaluation: Patient is a 22 y.o. male who was seen today for physical therapy evaluation and treatment of 6 week history of L lumbar radiculopathy. He has poor posture and alignment; limited trunk and LE mobility/ROM; muscular tightness L > R lumbar paraspinals, QL, piriformis, gluts, hip flexors; pain limiting functional activities; inability to work. Patient will benefit from PT to address problems identified.    OBJECTIVE IMPAIRMENTS: Abnormal gait, decreased mobility, decreased ROM, decreased strength, increased fascial restrictions, impaired flexibility, improper body mechanics, postural dysfunction, and pain.   ACTIVITY LIMITATIONS: carrying, lifting, bending, sitting, standing, sleeping, stairs, and locomotion level  PARTICIPATION LIMITATIONS: driving, community activity, occupation,  and bowling  PERSONAL FACTORS: Behavior pattern, Fitness, Profession, and Time since onset of injury/illness/exacerbation are also affecting patient's functional outcome.   REHAB POTENTIAL: Good  CLINICAL DECISION MAKING: Stable/uncomplicated  EVALUATION COMPLEXITY: Low   GOALS: Goals reviewed with patient? Yes  SHORT TERM GOALS: Target date: 09/19/2023   Independent in initial HEP  Baseline: Goal status: INITIAL  2.  Improve FOTO to 52 Baseline:  Goal status: INITIAL   LONG TERM GOALS: Target date: 10/31/2023  Decrease pain LB and L LE by 75-100%  Baseline:  Goal status: INITIAL  2.  Full pain free lumbar ROM  Baseline:  Goal status: INITIAL  3.  5/5 strength L LE  Baseline:  Goal status: INITIAL  4.  Improve core strength and stability with patient to demonstrate proper lifting and carrying techniques with core engaged  Baseline:  Goal status: INITIAL  5.  Independent in HEP, including aquatic program as indicated  Baseline:  Goal status: INITIAL  6.  Improve FOTO to 67 Baseline: 40 Goal status: INITIAL  PLAN:  PT FREQUENCY: 2x/week  PT DURATION: 12 weeks  PLANNED INTERVENTIONS: 97164- PT Re-evaluation, 97110-Therapeutic exercises, 97530- Therapeutic activity, [id number]- Neuromuscular re-education, 97535- Self Care, 16109- Manual therapy, [id number]- Gait training, [phone]- Aquatic Therapy, 97014- Electrical stimulation (unattended), [id number]- Ultrasound, 09811- Traction (mechanical), Patient/Family education, Dry Needling, Joint mobilization, Spinal mobilization, Cryotherapy, and Moist heat.  PLAN FOR NEXT SESSION: review and progress exercises; back care education; postural correction and education; manual work, DN, modalities as indicated    Clelia Schaumann, PT 08/15/2023, 10:43 AM

## 2023-08-19 ENCOUNTER — Ambulatory Visit: Payer: BC Managed Care – PPO | Admitting: Rehabilitative and Restorative Service Providers"

## 2023-08-19 ENCOUNTER — Encounter: Payer: Self-pay | Admitting: Rehabilitative and Restorative Service Providers"

## 2023-08-19 DIAGNOSIS — M6281 Muscle weakness (generalized): Secondary | ICD-10-CM

## 2023-08-19 DIAGNOSIS — R29898 Other symptoms and signs involving the musculoskeletal system: Secondary | ICD-10-CM

## 2023-08-19 DIAGNOSIS — M5416 Radiculopathy, lumbar region: Secondary | ICD-10-CM | POA: Diagnosis not present

## 2023-08-19 NOTE — Therapy (Signed)
OUTPATIENT PHYSICAL THERAPY THORACOLUMBAR TREATMENT  Patient Name: Riley Alvarez. MRN: [id number] DOB:August 17, 2001, 22 y.o., male Today's Date: 08/19/2023  END OF SESSION:  PT End of Session - 08/19/23 0855     Visit Number 3    Number of Visits 24    Date for PT Re-Evaluation 10/31/23    Authorization Type [employer] bcbs 85/15 no copay    PT Start Time 0850    PT Stop Time 0930    PT Time Calculation (min) 40 min    Activity Tolerance Patient tolerated treatment well             History reviewed. No pertinent past medical history. Past Surgical History:  Procedure Laterality Date   TONSILLECTOMY AND ADENOIDECTOMY  2005   Patient Active Problem List   Diagnosis Date Noted   Left lumbar radiculopathy 08/06/2023   Lesion of liver 05/05/2018   Hyperlipidemia, mixed 11/21/2016   Annual physical exam 11/20/2016   Obesity (BMI 30-39.9) 11/20/2016   Primary insomnia 11/20/2016    PCP: Dr Rodney Langton  REFERRING PROVIDER: Dr Rodney Langton  REFERRING DIAG: L lumbar radiculopathy   Rationale for Evaluation and Treatment: Rehabilitation  THERAPY DIAG:  Left lumbar radiculopathy  Other symptoms and signs involving the musculoskeletal system  Muscle weakness (generalized)  ONSET DATE: 06/25/23  SUBJECTIVE:                                                                                                                                                                                           SUBJECTIVE STATEMENT: Patient reports that he is only having random shooting pain into the L LE which does not last. He has persistent pain in the L posterior hip. The extension exercises are good. Dry needing was "interesting" he does not feel a difference form the needling.    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 The L hip pain is constant. Symptoms have increased over the past 6 weeks. Symptoms are worse as the day progresses.   PERTINENT HISTORY:  Appendectomy; otherwise unremarkable   PAIN:  Are you having pain? Yes: NPRS scale: 4-5/10 Pain location: L posterior hip  Pain description: sharp; constant  Aggravating factors: weight bearing; bending over; sitting; life  Relieving factors: nothing  PRECAUTIONS: None  RED FLAGS: None   WEIGHT BEARING RESTRICTIONS: No  FALLS:  Has patient fallen in last 6 months? 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  DIAGNOSTIC FINDINGS:  Xray 08/06/23 - results not available   PATIENT SURVEYS:  FOTO 40; goal 67   SENSATION: Posterior thigh on an intermittent basis   MUSCLE LENGTH: Hamstrings: Right 60 deg; Left 55 deg   POSTURE: rounded shoulders, forward head, and flexed trunk   PALPATION: muscular tightness L > R lumbar paraspinals, QL, piriformis, gluts, hip flexors  LUMBAR ROM:   AROM eval  Flexion 85% pain L LB  Extension 35%  Right lateral flexion 90% sharp pain L side   Left lateral flexion 95%  Right rotation 75% tight L LB  Left rotation 70% pain L LB    (Blank rows = not tested)  LOWER EXTREMITY ROM:   pain with resisted testing   Active  Right eval Left eval  Hip flexion 5 5-  Hip extension 5 4  Hip abduction 5 4  Hip adduction    Hip internal rotation    Hip external rotation    Knee flexion    Knee extension    Ankle dorsiflexion    Ankle plantarflexion    Ankle inversion    Ankle eversion     (Blank rows = not  tested)  LOWER EXTREMITY MMT:    MMT Right eval Left eval  Hip flexion    Hip extension tight tight  Hip abduction    Hip adduction    Hip internal rotation tight tight  Hip external rotation    Knee flexion    Knee extension    Ankle dorsiflexion    Ankle plantarflexion    Ankle inversion    Ankle eversion     (Blank rows = not tested)  LUMBAR SPECIAL TESTS:  Straight leg raise test: Negative and Slump test: Negative  FUNCTIONAL TESTS:  5 times sit to stand: 17.68 sec pain with sit to stand mvt  SLS - 10 sec R/L   GAIT: Distance walked: 40 ft  Assistive device utilized: None Level of assistance: Complete Independence Comments: antalgic gait with limp L LE in wt bearing L   [facility] Adult PT Treatment:                                                DATE: 08/19/23 Therapeutic Exercise: Standing  Trunk extension to tolerance 2 reps 1-2 sec  Wall squat 10 sec x 10  Row blue TB 3 sec x 10  Bow and arrow blue TB 3 sec x 10  Palloff press blue TB 3 sec x 10 R/L  Prone  Partial press up through full range exhale and let stomach sag at end range - 2-3 sec 8  (no discomfort R wrist prior symptoms) Prone prop ~ 2 min pillow under chest  Supine  Diaphragmatic breathing count of 6  4 part core transverse abdominals  Piriformis stretch 30 sec x 3 Hamstring stretch 30 sec x 2 Hip flexor stretch 30 sec x 2 Sciatic nerve glide 1-2 sec x 10  Sitting  Sit to stand hinged hips x 10  Manual Therapy: STM L posterior hip  Skilled palpation to assess response to manual work and DN  Trigger Point Dry-Needling  Treatment instructions: Expect mild to moderate muscle soreness. Patient verbalized understanding of these instructions and education.  Patient Consent Given: Yes Education handout provided: Previously provided Muscles treated: L posterior hip - piriformis; glut med; glut max  Electrical stimulation performed: No Parameters: N/A Treatment response/outcome: decreased  palpable tightness   Modalities: Consider trial of TENS for pain management  Self Care: Discussed sitting in supported posture avoiding rounded posture and soft surfaces    [facility] Adult PT Treatment:                                                DATE: 08/15/2023 Trigger Point Dry-Needling  Treatment instructions: Expect mild to moderate muscle soreness. S/S of pneumothorax if dry needled over a lung field, and to seek immediate medical attention should they occur. Patient verbalized understanding of these instructions and education. Patient Consent Given: Yes Education handout provided: No Muscles treated: L lumbar paraspinals, L gluteus medius in R sidelying Electrical stimulation performed: Yes Parameters:  Low frequency x 10 minutes ; muscular contraction achieved at both areas Treatment response/outcome: reduced L hip pain upon standing Manual Therapy: R sidelying: Skilled assessment for taut bands within the L lumbar paraspinals and L gluteus medius Soft tissue mobilization to L lumbar paraspinals and L gluteus medius post dry needling  [facility] Adult PT Treatment:                                                DATE: 08/08/23 Therapeutic Exercise: Standing  Trunk extension to tolerance 2 reps 1-2 sec  Prone  Partial press up to tolerance 2 sec x 5 (some discomfort R wrist prior symptoms) Prone prop ~ 2 min pillow under chest  Supine  Diaphragmatic breathing count of 6  4 part core transverse abdominals  Piriformis stretch 30 sec x 2 Hamstring stretch 30 sec x 2 Hip flexor stretch 30 sec x 2 Sciatic nerve glide 1-2 sec x 10  Manual Therapy: Add trial of manual therapy lumbar spine and L posterior hip; consider trial of DN L posterior hip  Modalities: Consider trial of TENS for pain management  Self Care: Discussed sitting in supported posture avoiding rounded posture and soft surfaces   PATIENT EDUCATION:  Education details: POC; HEP  Person educated: Patient Education  method: Explanation, Demonstration, Tactile cues, Verbal cues, and Handouts Education comprehension: verbalized understanding, returned demonstration, verbal cues required, tactile cues required, and needs further education  HOME EXERCISE PROGRAM:   Patient Education - PostuAccess Code: [email or web address] URL: [email or web address] Date: 08/19/2023 Prepared by: Corlis Leak  Exercises - Prone Press Up  - 2 x daily - 7 x weekly - 1 sets - 10 reps - 2-3 sec  hold - Prone Press Up On Elbows  - 2 x daily - 7 x weekly - 1 sets - 3 reps - 30 sec  hold - Standing Lumbar Extension  - 2 x daily - 7 x weekly - 1 sets - 2-3 reps - 2-3 sec  hold - Supine Transversus Abdominis Bracing with Pelvic Floor Contraction  -  2 x daily - 7 x weekly - 1 sets - 10 reps - 10sec  hold - Supine Diaphragmatic Breathing  - 2 x daily - 7 x weekly - 1 sets - 10 reps - 4-6 sec  hold - Supine Piriformis Stretch with Leg Straight  - 2 x daily - 7 x weekly - 1 sets - 3 reps - 30 sec  hold - Hooklying Hamstring Stretch with Strap  - 2 x daily - 7 x weekly - 1 sets - 3 reps - 30 sec  hold - Supine Sciatic Nerve Glide  - 2 x daily - 7 x weekly - 1 sets - 8-10 reps - 1-2 sec  hold - Hip Flexor Stretch at Edge of Bed  - 2 x daily - 7 x weekly - 1 sets - 3 reps - 30 sec  hold - Sit to Stand  - 2 x daily - 7 x weekly - 1 sets - 10 reps - 3-5 sec  hold - Wall Quarter Squat  - 2 x daily - 7 x weekly - 1-2 sets - 10 reps - 5-10 sec  hold - Standing Bilateral Low Shoulder Row with Anchored Resistance  - 2 x daily - 7 x weekly - 1-3 sets - 10 reps - 2-3 sec  hold - Drawing Bow  - 1 x daily - 7 x weekly - 1 sets - 10 reps - 3 sec  hold - Anti-Rotation Lateral Stepping with Press  - 2 x daily - 7 x weekly - 1-2 sets - 10 reps - 2-3 sec  holdre and Body Mechanics - Trigger Point Dry Needling - TENS Unit  ASSESSMENT:  CLINICAL IMPRESSION: Patient reports that L LE pain has resolved. He has a random shooting pain down the L LE but  pain does not last. He has continued palpable tightness L posterior hip through the piriformis and gluts with good release of muscular tightness. Added core strengthening exercises.   08/08/23: Trailed dry needling today to the L lumbar paraspinals and L gluteus medius with low frequency electrical stimulation. The patient tolerated the treatment well, some pain with needle insertion which resolved as needles remained in situ. No adverse response noted following treatment and patient reported some immediate reduction in L hip pain upon standing. Patient will continue to monitor symptoms over next 48 hours to determine if he thinks dry needling was helpful, and the intervention can be repeated at future sessions should benefit be noted. He will also continue with his home program. Did not follow up with therapeutic exercise as to not potentially aggravate symptoms after treatment so patient can determine the effect of dry needling alone. Physical therapy remains indicated.   From evaluation: Patient is a 22 y.o. male who was seen today for physical therapy evaluation and treatment of 6 week history of L lumbar radiculopathy. He has poor posture and alignment; limited trunk and LE mobility/ROM; muscular tightness L > R lumbar paraspinals, QL, piriformis, gluts, hip flexors; pain limiting functional activities; inability to work. Patient will benefit from PT to address problems identified.    OBJECTIVE IMPAIRMENTS: Abnormal gait, decreased mobility, decreased ROM, decreased strength, increased fascial restrictions, impaired flexibility, improper body mechanics, postural dysfunction, and pain.   GOALS: Goals reviewed with patient? Yes  SHORT TERM GOALS: Target date: 09/19/2023   Independent in initial HEP  Baseline: Goal status: INITIAL  2.  Improve FOTO to 52 Baseline:  Goal status: INITIAL   LONG TERM GOALS: Target date: 10/31/2023  Decrease pain LB and L LE by 75-100%  Baseline:  Goal status:  INITIAL  2.  Full pain free lumbar ROM  Baseline:  Goal status: INITIAL  3.  5/5 strength L LE  Baseline:  Goal status: INITIAL  4.  Improve core strength and stability with patient to demonstrate proper lifting and carrying techniques with core engaged  Baseline:  Goal status: INITIAL  5.  Independent in HEP, including aquatic program as indicated  Baseline:  Goal status: INITIAL  6.  Improve FOTO to 67 Baseline: 40 Goal status: INITIAL  PLAN:  PT FREQUENCY: 2x/week  PT DURATION: 12 weeks  PLANNED INTERVENTIONS: 97164- PT Re-evaluation, 97110-Therapeutic exercises, 97530- Therapeutic activity, [id number]- Neuromuscular re-education, 97535- Self Care, 95621- Manual therapy, [id number]- Gait training, [phone]- Aquatic Therapy, 97014- Electrical stimulation (unattended), [id number]- Ultrasound, 78469- Traction (mechanical), Patient/Family education, Dry Needling, Joint mobilization, Spinal mobilization, Cryotherapy, and Moist heat.  PLAN FOR NEXT SESSION: review and progress exercises; back care education; postural correction and education; manual work, DN, modalities as indicated    Cniyah Sproull Rober Minion, PT 08/19/2023, 8:56 AM

## 2023-08-21 ENCOUNTER — Ambulatory Visit: Payer: BC Managed Care – PPO | Admitting: Rehabilitative and Restorative Service Providers"

## 2023-08-21 ENCOUNTER — Encounter: Payer: Self-pay | Admitting: Rehabilitative and Restorative Service Providers"

## 2023-08-21 DIAGNOSIS — M6281 Muscle weakness (generalized): Secondary | ICD-10-CM

## 2023-08-21 DIAGNOSIS — M5416 Radiculopathy, lumbar region: Secondary | ICD-10-CM | POA: Diagnosis not present

## 2023-08-21 DIAGNOSIS — R29898 Other symptoms and signs involving the musculoskeletal system: Secondary | ICD-10-CM

## 2023-08-21 NOTE — Therapy (Addendum)
 OUTPATIENT PHYSICAL THERAPY THORACOLUMBAR TREATMENT  PHYSICAL THERAPY DISCHARGE SUMMARY  Visits from Start of Care: 4  Current functional level related to goals / functional outcomes: See progress note for discharge status    Remaining deficits: Unknown    Education / Equipment: HEP    Patient agrees to discharge. Patient goals were partially met. Patient is being discharged due to being pleased with the current functional level.  Linus Weckerly P. Leonor Liv PT, MPH 12/05/23 4:37 PM    Patient Name: Riley Alvarez. MRN: [id number] DOB:05-30-2001, 22 y.o., male Today's Date: 08/21/2023  END OF SESSION:  PT End of Session - 08/21/23 1018     Visit Number 4    Number of Visits 24    Date for PT Re-Evaluation 10/31/23    Authorization Type [employer] bcbs 85/15 no copay    PT Start Time 1015    PT Stop Time 1105    PT Time Calculation (min) 50 min    Activity Tolerance Patient tolerated treatment well             History reviewed. No pertinent past medical history. Past Surgical History:  Procedure Laterality Date   TONSILLECTOMY AND ADENOIDECTOMY  2005   Patient Active Problem List   Diagnosis Date Noted   Left lumbar radiculopathy 08/06/2023   Lesion of liver 05/05/2018   Hyperlipidemia, mixed 11/21/2016   Annual physical exam 11/20/2016   Obesity (BMI 30-39.9) 11/20/2016   Primary insomnia 11/20/2016    PCP: Dr Rodney Langton  REFERRING PROVIDER: Dr Rodney Langton  REFERRING DIAG: L lumbar radiculopathy   Rationale for Evaluation and Treatment: Rehabilitation  THERAPY DIAG:  Left lumbar radiculopathy  Other symptoms and signs involving the musculoskeletal system  Muscle weakness (generalized)  ONSET DATE: 06/25/23  SUBJECTIVE:                                                                                                                                                                                           SUBJECTIVE STATEMENT: Patient  reports that he still has only random shooting pain into the L LE about 3-4 times yesterday and only last a few seconds. He has persistent pain in the L posterior hip. The extension exercises are good. Dry needing did not make difference in the hip. He reports some intermittent L knee pain since childhood with no specific injury. L knee does not like the wall squat.    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 The L hip pain is constant. Symptoms have increased over the past 6 weeks. Symptoms are worse as the day progresses.   PERTINENT HISTORY:  Appendectomy; otherwise unremarkable   PAIN:  Are you having pain? Yes: NPRS scale: 5/10 Pain location: L posterior hip  Pain description: sharp; constant  Aggravating factors: weight bearing; bending over; sitting; life  Relieving factors: nothing  PRECAUTIONS: None   WEIGHT BEARING RESTRICTIONS: No  FALLS:  Has patient fallen in last 6 months? 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  DIAGNOSTIC FINDINGS:  Xray 08/06/23 - results not available   PATIENT SURVEYS:  FOTO 40; goal 67   SENSATION: Posterior thigh on an intermittent basis   MUSCLE LENGTH: Hamstrings: Right 60 deg; Left 55 deg   POSTURE: rounded shoulders, forward head, and flexed trunk   PALPATION: muscular tightness L > R lumbar paraspinals, QL, piriformis, gluts, hip flexors  LUMBAR ROM:   AROM eval  Flexion 85% pain L LB   Extension 35%  Right lateral flexion 90% sharp pain L side   Left lateral flexion 95%  Right rotation 75% tight L LB  Left rotation 70% pain L LB    (Blank rows = not tested)  LOWER EXTREMITY ROM:   pain with resisted testing   Active  Right eval Left eval  Hip flexion 5 5-  Hip extension 5 4  Hip abduction 5 4  Hip adduction    Hip internal rotation    Hip external rotation    Knee flexion    Knee extension    Ankle dorsiflexion    Ankle plantarflexion    Ankle inversion    Ankle eversion     (Blank rows = not tested)  LOWER EXTREMITY MMT:    MMT Right eval Left eval  Hip flexion    Hip extension tight tight  Hip abduction    Hip adduction    Hip internal rotation tight tight  Hip external rotation    Knee flexion    Knee extension    Ankle dorsiflexion    Ankle plantarflexion    Ankle inversion    Ankle eversion     (Blank rows = not tested)  LUMBAR SPECIAL TESTS:  Straight leg raise test: Negative and Slump test: Negative  FUNCTIONAL TESTS:  5 times sit to stand: 17.68 sec pain with sit to stand mvt  SLS - 10 sec R/L   GAIT: Distance walked: 40 ft  Assistive device utilized: None Level of assistance: Complete Independence Comments: antalgic gait with limp L LE in wt bearing L    [facility] Adult PT Treatment:                                                DATE: 08/21/23 Therapeutic Exercise: Nustep L 6 x 5 min  Standing  Trunk extension to tolerance 2 reps 1-2 sec  Row blue TB 3 sec x 10  Bow and arrow blue TB 3 sec x 10  Palloff press blue TB 3 sec x 10 R/L  Modified dead black super band 5 sec x 10  Prone  Partial press up through full range exhale and let stomach sag at end range - 2-3 sec 8  (no discomfort R wrist prior symptoms) Prone prop ~ 2 min pillow under chest  Supine  Diaphragmatic breathing count of 6 (HEP) 4 part core transverse abdominals w/ exercises  Piriformis stretch 30 sec x 3 Hamstring stretch 30 sec x 2 Hip flexor  stretch 30 sec x 2 Sciatic nerve glide 1-2 sec x 10  Sitting  Hip flexor stretch 30 sec x 1 R; 2 L  Sit to stand hinged hips x 10  Modalities: TENS L posterior hip x 15 min  Moist heat LB/posterior hips Self Care: Sitting in supported posture avoiding rounded posture and soft surfaces    [facility] Adult PT Treatment:                                                DATE: 08/19/23 Therapeutic Exercise: Standing  Trunk extension to tolerance 2 reps 1-2 sec  Wall squat 10 sec x 10  Row blue TB 3 sec x 10  Bow and arrow blue TB 3 sec x 10  Palloff press blue TB 3 sec x 10 R/L  Prone  Partial press up through full range exhale and let stomach sag at end range - 2-3 sec 8  (no discomfort R wrist prior symptoms) Prone prop ~ 2 min pillow under chest  Supine  Diaphragmatic breathing count of 6  4 part core transverse abdominals  Piriformis stretch 30 sec x 3 Hamstring stretch 30 sec x 2 Hip flexor stretch 30 sec x 2 Sciatic nerve glide 1-2 sec x 10  Sitting  Sit to stand hinged hips x 10  Manual Therapy: STM L posterior hip  Skilled palpation to assess response to manual work and DN  Trigger Point Dry-Needling  Treatment instructions: Expect mild to moderate muscle soreness. Patient verbalized understanding of these instructions and education.  Patient Consent Given: Yes Education handout provided: Previously provided Muscles treated: L posterior hip - piriformis; glut med; glut max  Electrical stimulation performed: No Parameters: N/A Treatment response/outcome: decreased palpable tightness   Modalities: Consider trial of TENS for pain management  Self Care: Discussed sitting in supported posture avoiding rounded posture and soft surfaces    [facility] Adult PT Treatment:                                                DATE: 08/15/2023 Trigger Point Dry-Needling  Treatment instructions: Expect mild to moderate muscle soreness. S/S of pneumothorax if dry needled over a lung field, and  to seek immediate medical attention should they occur. Patient verbalized understanding of these instructions and education. Patient Consent Given: Yes Education handout provided: No Muscles treated: L lumbar paraspinals, L gluteus medius in R sidelying Electrical stimulation performed: Yes Parameters:  Low frequency x 10 minutes ; muscular contraction achieved at both areas Treatment response/outcome: reduced L hip pain upon standing Manual Therapy: R sidelying: Skilled assessment for taut bands within the L lumbar paraspinals and L gluteus  medius Soft tissue mobilization to L lumbar paraspinals and L gluteus medius post dry needling  [facility] Adult PT Treatment:                                                DATE: 08/08/23 Therapeutic Exercise: Standing  Trunk extension to tolerance 2 reps 1-2 sec  Prone  Partial press up to tolerance 2 sec x 5 (some discomfort R wrist prior symptoms) Prone prop ~ 2 min pillow under chest  Supine  Diaphragmatic breathing count of 6  4 part core transverse abdominals  Piriformis stretch 30 sec x 2 Hamstring stretch 30 sec x 2 Hip flexor stretch 30 sec x 2 Sciatic nerve glide 1-2 sec x 10  Manual Therapy: Add trial of manual therapy lumbar spine and L posterior hip; consider trial of DN L posterior hip  Modalities: Consider trial of TENS for pain management  Self Care: Discussed sitting in supported posture avoiding rounded posture and soft surfaces   PATIENT EDUCATION:  Education details: POC; HEP  Person educated: Patient Education method: Explanation, Demonstration, Tactile cues, Verbal cues, and Handouts Education comprehension: verbalized understanding, returned demonstration, verbal cues required, tactile cues required, and needs further education  HOME EXERCISE PROGRAM: Access Code: [email or web address] URL: [email or web address] Date: 08/21/2023 Prepared by: Corlis Leak  Exercises - Prone Press Up  - 2 x daily - 7 x weekly - 1 sets  - 10 reps - 2-3 sec  hold - Prone Press Up On Elbows  - 2 x daily - 7 x weekly - 1 sets - 3 reps - 30 sec  hold - Standing Lumbar Extension  - 2 x daily - 7 x weekly - 1 sets - 2-3 reps - 2-3 sec  hold - Supine Transversus Abdominis Bracing with Pelvic Floor Contraction  - 2 x daily - 7 x weekly - 1 sets - 10 reps - 10sec  hold - Supine Diaphragmatic Breathing  - 2 x daily - 7 x weekly - 1 sets - 10 reps - 4-6 sec  hold - Supine Piriformis Stretch with Leg Straight  - 2 x daily - 7 x weekly - 1 sets - 3 reps - 30 sec  hold - Hooklying Hamstring Stretch with Strap  - 2 x daily - 7 x weekly - 1 sets - 3 reps - 30 sec  hold - Supine Sciatic Nerve Glide  - 2 x daily - 7 x weekly - 1 sets - 8-10 reps - 1-2 sec  hold - Hip Flexor Stretch at Edge of Bed  - 2 x daily - 7 x weekly - 1 sets - 3 reps - 30 sec  hold - Sit to Stand  - 2 x daily - 7 x weekly - 1 sets - 10 reps - 3-5 sec  hold - Standing Bilateral Low Shoulder Row with Anchored Resistance  - 1 x daily - 7 x weekly - 1-3 sets - 10 reps - 2-3 sec  hold - Drawing Bow  - 1 x daily - 7 x weekly - 1 sets - 10 reps - 3 sec  hold - Anti-Rotation Lateral Stepping with Press  - 1 x daily - 7 x weekly - 1-2 sets - 10 reps - 2-3 sec  hold - Deadlift with Resistance  - 1 x daily - 3-4 x weekly - 1 sets -  10 reps  - Trigger Point Dry Needling - TENS Unit  ASSESSMENT:  CLINICAL IMPRESSION: Patient reports that L LE pain has resolved with only a few random shooting pains down the L LE but pain does not last. He has continued palpable tightness L posterior hip through the piriformis and gluts with good release of muscular tightness. Continued with core strengthening exercises. Trial of TENS L posterior hip.  08/08/23: Trailed dry needling today to the L lumbar paraspinals and L gluteus medius with low frequency electrical stimulation. The patient tolerated the treatment well, some pain with needle insertion which resolved as needles remained in situ. No  adverse response noted following treatment and patient reported some immediate reduction in L hip pain upon standing. Patient will continue to monitor symptoms over next 48 hours to determine if he thinks dry needling was helpful, and the intervention can be repeated at future sessions should benefit be noted. He will also continue with his home program. Did not follow up with therapeutic exercise as to not potentially aggravate symptoms after treatment so patient can determine the effect of dry needling alone. Physical therapy remains indicated.   From evaluation: Patient is a 22 y.o. male who was seen today for physical therapy evaluation and treatment of 6 week history of L lumbar radiculopathy. He has poor posture and alignment; limited trunk and LE mobility/ROM; muscular tightness L > R lumbar paraspinals, QL, piriformis, gluts, hip flexors; pain limiting functional activities; inability to work. Patient will benefit from PT to address problems identified.    OBJECTIVE IMPAIRMENTS: Abnormal gait, decreased mobility, decreased ROM, decreased strength, increased fascial restrictions, impaired flexibility, improper body mechanics, postural dysfunction, and pain.   GOALS: Goals reviewed with patient? Yes  SHORT TERM GOALS: Target date: 09/19/2023   Independent in initial HEP  Baseline: Goal status: INITIAL  2.  Improve FOTO to 52 Baseline:  Goal status: INITIAL   LONG TERM GOALS: Target date: 10/31/2023  Decrease pain LB and L LE by 75-100%  Baseline:  Goal status: INITIAL  2.  Full pain free lumbar ROM  Baseline:  Goal status: INITIAL  3.  5/5 strength L LE  Baseline:  Goal status: INITIAL  4.  Improve core strength and stability with patient to demonstrate proper lifting and carrying techniques with core engaged  Baseline:  Goal status: INITIAL  5.  Independent in HEP, including aquatic program as indicated  Baseline:  Goal status: INITIAL  6.  Improve FOTO to  67 Baseline: 40 Goal status: INITIAL  PLAN:  PT FREQUENCY: 2x/week  PT DURATION: 12 weeks  PLANNED INTERVENTIONS: 97164- PT Re-evaluation, 97110-Therapeutic exercises, 97530- Therapeutic activity, [id number]- Neuromuscular re-education, 97535- Self Care, 57846- Manual therapy, [id number]- Gait training, [phone]- Aquatic Therapy, 97014- Electrical stimulation (unattended), [id number]- Ultrasound, 28413- Traction (mechanical), Patient/Family education, Dry Needling, Joint mobilization, Spinal mobilization, Cryotherapy, and Moist heat.  PLAN FOR NEXT SESSION: review and progress exercises; back care education; postural correction and education; manual work, DN, modalities as indicated    [employer], PT 08/21/2023, 10:54 AM

## 2023-08-26 ENCOUNTER — Ambulatory Visit: Payer: BC Managed Care – PPO | Admitting: Physical Therapy

## 2023-08-29 ENCOUNTER — Ambulatory Visit: Payer: BC Managed Care – PPO

## 2023-09-02 ENCOUNTER — Ambulatory Visit: Payer: BC Managed Care – PPO | Admitting: Physical Therapy

## 2023-09-05 ENCOUNTER — Ambulatory Visit: Payer: BC Managed Care – PPO

## 2023-09-24 ENCOUNTER — Encounter: Payer: Self-pay | Admitting: Sports Medicine

## 2023-09-24 ENCOUNTER — Ambulatory Visit (INDEPENDENT_AMBULATORY_CARE_PROVIDER_SITE_OTHER): Payer: BC Managed Care – PPO | Admitting: Sports Medicine

## 2023-09-24 DIAGNOSIS — M5416 Radiculopathy, lumbar region: Secondary | ICD-10-CM | POA: Diagnosis not present

## 2023-09-24 DIAGNOSIS — R519 Headache, unspecified: Secondary | ICD-10-CM | POA: Diagnosis not present

## 2023-09-24 MED ORDER — RIZATRIPTAN BENZOATE 10 MG PO TBDP: 10.0000 mg | ORAL_TABLET | ORAL | 3 refills | Status: AC | PRN

## 2023-09-24 NOTE — Progress Notes (Signed)
    Procedures performed today:    None.  Independent interpretation of notes and tests performed by another provider:   None.  Brief History, Exam, Impression, and Recommendations:    Left lumbar radiculopathy Pleasant 6 male returns, persistent axial back pain, left-sided with radiation down the back of the left leg to the left lower leg, Achilles but not quite to the foot, he had a positive straight leg raise, good reflexes, unfortunately he has failed greater than 6 weeks of medication and formal physical therapy He works as a Music Therapist. We are proceeding with MRI for epidural planning. He would like to do a virtual follow-up to go over the MRI results before I order the epidural.  Migraine headaches Pleasant 22 year old male, he has had headaches approximately 2 days/month, he describes them as initially frontal followed by throbbing, photophobia, phonophobia, nausea with vomiting, no focal neurologic symptoms and no aura. He has never had CNS imaging. His exam is normal today, as these have never been imaged we will proceed with brain MRI with and without contrast. We will also start Maxalt  5 to 10 mg as needed to abort the migraines. Frequency is not enough yet to consider preventative treatment.    ____________________________________________ Debby PARAS. Curtis, M.D., ABFM., CAQSM., AME. Primary Care and Sports Medicine [facility]  Adjunct Professor of [facility] Medicine  [employer]  Restaurant Manager, [employer]

## 2023-09-24 NOTE — Assessment & Plan Note (Signed)
 Pleasant 68 male returns, persistent axial back pain, left-sided with radiation down the back of the left leg to the left lower leg, Achilles but not quite to the foot, he had a positive straight leg raise, good reflexes, unfortunately he has failed greater than 6 weeks of medication and formal physical therapy He works as a Music Therapist. We are proceeding with MRI for epidural planning. He would like to do a virtual follow-up to go over the MRI results before I order the epidural.

## 2023-09-24 NOTE — Assessment & Plan Note (Signed)
 Pleasant 22 year old male, he has had headaches approximately 2 days/month, he describes them as initially frontal followed by throbbing, photophobia, phonophobia, nausea with vomiting, no focal neurologic symptoms and no aura. He has never had CNS imaging. His exam is normal today, as these have never been imaged we will proceed with brain MRI with and without contrast. We will also start Maxalt  5 to 10 mg as needed to abort the migraines. Frequency is not enough yet to consider preventative treatment.

## 2023-10-21 ENCOUNTER — Encounter (INDEPENDENT_AMBULATORY_CARE_PROVIDER_SITE_OTHER): Payer: Self-pay | Admitting: Sports Medicine

## 2023-10-21 DIAGNOSIS — K047 Periapical abscess without sinus: Secondary | ICD-10-CM | POA: Diagnosis not present

## 2023-10-22 DIAGNOSIS — K047 Periapical abscess without sinus: Secondary | ICD-10-CM | POA: Insufficient documentation

## 2023-10-22 MED ORDER — AMOXICILLIN-POT CLAVULANATE 875-125 MG PO TABS: 1.0000 | ORAL_TABLET | Freq: Two times a day (BID) | ORAL | 0 refills | Status: DC

## 2023-10-22 NOTE — Telephone Encounter (Signed)

## 2023-11-25 ENCOUNTER — Encounter: Payer: Self-pay | Admitting: Sports Medicine

## 2023-11-25 NOTE — Telephone Encounter (Signed)
 Spoke to [facility] and they informed me patients insurance schedule outside of [place]. I called the patient and advised him to get information from [facility].

## 2023-11-26 ENCOUNTER — Telehealth: Payer: Self-pay | Admitting: Sports Medicine

## 2023-11-26 ENCOUNTER — Telehealth: Payer: Self-pay

## 2023-11-26 NOTE — Telephone Encounter (Signed)
 Spoke to [place] and gave her all of the patients info, as well as sent fax.Thanks

## 2023-11-26 NOTE — Telephone Encounter (Signed)
 Copied from CRM [id number]. Topic: Clinical - Request for Lab/Test Order >> Nov 25, 2023  4:47 PM Victorino Dike T wrote: Reason for CRM: Teola Bradley with [facility]- need copy of order and clarification of orders for MRI- fax [phone]  phone [phone]

## 2023-11-26 NOTE — Telephone Encounter (Signed)
 Teola Bradley with [facility]   Phone # [phone] Fax# [phone] Called states patient would like to use their services for the Brain MRI ordered  as will be covered at 100% under insurance plan  Will need copy of order and details be faxed to them  Also requesting  a call back regarding this at above number. -  Requesting dx code for procedure be given as well

## 2023-11-26 NOTE — Telephone Encounter (Signed)
 Can probably just fax the orders, they are in the system under the imaging tab, lumbar spine and brain.

## 2023-12-26 ENCOUNTER — Encounter: Payer: Self-pay | Admitting: Sports Medicine

## 2023-12-30 ENCOUNTER — Encounter (INDEPENDENT_AMBULATORY_CARE_PROVIDER_SITE_OTHER): Payer: Self-pay | Admitting: Sports Medicine

## 2023-12-30 DIAGNOSIS — R519 Headache, unspecified: Secondary | ICD-10-CM

## 2023-12-30 NOTE — Telephone Encounter (Signed)

## 2024-05-01 ENCOUNTER — Ambulatory Visit: Admitting: Sports Medicine

## 2024-05-26 ENCOUNTER — Encounter: Payer: Self-pay | Admitting: Sports Medicine

## 2024-09-09 ENCOUNTER — Ambulatory Visit
Admission: EM | Admit: 2024-09-09 | Discharge: 2024-09-09 | Disposition: A | Discharge status: Home / Self Care | Attending: Family Medicine | Admitting: Family Medicine

## 2024-09-09 DIAGNOSIS — M109 Gout, unspecified: Secondary | ICD-10-CM | POA: Diagnosis not present

## 2024-09-09 MED ORDER — METHYLPREDNISOLONE 4 MG PO TBPK: ORAL_TABLET | ORAL | 0 refills | Status: AC

## 2024-09-09 NOTE — Discharge Instructions (Signed)
 Take the Medrol  Dosepak as directed Take all of day 1 today as a single dose when you get the prescription filled (loading dose of 6 pills) After this take it on schedule Walking while foot is painful Return as needed

## 2024-09-09 NOTE — ED Triage Notes (Signed)
 Pt c/o RT foot pain x 2 days, particularly at the base of the 5th toe. Denies injury. Taking ibuprofen and icing prn.

## 2024-09-09 NOTE — ED Provider Notes (Signed)
 Riley Alvarez    CSN: [id number] Arrival date & time: 09/09/24  9073      History   Chief Complaint Chief Complaint  Patient presents with   Foot Pain    RT    HPI Riley Alvarez. is a 23 y.o. male.   HPI  Patient has 2 days of pain in his right foot.  Getting worse with time.  This morning can hardly put weight on his foot.  No trauma or injury.  Never had a history of gout.  He works for [employer] and is on his feet a lot.  Does not believe he has a repetitive motion injury.  History reviewed. No pertinent past medical history.  Patient Active Problem List   Diagnosis Date Noted   Periapical abscess 10/22/2023   Migraine headaches 09/24/2023   Left lumbar radiculopathy 08/06/2023   Lesion of liver 05/05/2018   Hyperlipidemia, mixed 11/21/2016   Annual physical exam 11/20/2016   Obesity (BMI 30-39.9) 11/20/2016   Primary insomnia 11/20/2016    Past Surgical History:  Procedure Laterality Date   TONSILLECTOMY AND ADENOIDECTOMY  2005       Home Medications    Prior to Admission medications  Medication Sig Start Date End Date Taking? Authorizing Provider  methylPREDNISolone  (MEDROL  DOSEPAK) 4 MG TBPK tablet tad 09/09/24  Yes Maranda Jamee Jacob, MD  Liraglutide  -Weight Management (SAXENDA ) 18 MG/3ML SOPN Inject 3 mg into the skin daily. 0.6 mg inj subcut daily for 1 week, then incr by 0.6 mg weekly until reaching 3 mg injected subcut daily 03/11/23   Curtis Debby PARAS, MD  ondansetron  (ZOFRAN -ODT) 8 MG disintegrating tablet Take 1 tablet (8 mg total) by mouth every 8 (eight) hours as needed for nausea. 05/06/18   Curtis Debby PARAS, MD  rizatriptan  (MAXALT -MLT) 10 MG disintegrating tablet Take 1 tablet (10 mg total) by mouth as needed for migraine. May repeat in 2 hours if needed 09/24/23   Curtis Debby PARAS, MD  traZODone  (DESYREL ) 50 MG tablet One half tab nightly for 2 weeks then 1 tab nightly for 2 weeks then 2 tabs nightly, only  increase if needed 06/06/22   Curtis Debby PARAS, MD    Family History Family History  Problem Relation Age of Onset   Depression Mother    Hypertension Father    Cancer Maternal Aunt        colon   Diabetes Paternal Uncle    Hyperlipidemia Maternal Grandmother    Hypertension Maternal Grandmother    Hyperlipidemia Maternal Grandfather    Diabetes Paternal Grandmother     Social History Social History[1]   Allergies   Patient has no known allergies.   Review of Systems Review of Systems  See HPI Physical Exam Triage Vital Signs ED Triage Vitals  Encounter Vitals Group     BP 09/09/24 0946 (!) 140/93     Girls Systolic BP Percentile --      Girls Diastolic BP Percentile --      Boys Systolic BP Percentile --      Boys Diastolic BP Percentile --      Pulse Rate 09/09/24 0946 89     Resp 09/09/24 0946 17     Temp 09/09/24 0946 98 F (36.7 C)     Temp Source 09/09/24 0946 Oral     SpO2 09/09/24 0946 97 %     Weight --      Height --      Head Circumference --  Peak Flow --      Pain Score 09/09/24 0943 8     Pain Loc --      Pain Education --      Exclude from Growth Chart --    No data found.  Updated Vital Signs BP (!) 140/93 (BP Location: Right Arm)   Pulse 89   Temp 98 F (36.7 C) (Oral)   Resp 17   SpO2 97%      Physical Exam Constitutional:      General: He is not in acute distress.    Appearance: He is well-developed and normal weight.  HENT:     Head: Normocephalic and atraumatic.  Eyes:     Conjunctiva/sclera: Conjunctivae normal.     Pupils: Pupils are equal, round, and reactive to light.  Cardiovascular:     Rate and Rhythm: Normal rate.  Pulmonary:     Effort: Pulmonary effort is normal. No respiratory distress.  Abdominal:     General: There is no distension.     Palpations: Abdomen is soft.  Musculoskeletal:        General: Normal range of motion.     Cervical back: Normal range of motion.       Feet:  Skin:     General: Skin is warm and dry.  Neurological:     Mental Status: He is alert.      UC Treatments / Results  Labs (all labs ordered are listed, but only abnormal results are displayed) Labs Reviewed - No data to display  EKG   Radiology No results found.  Procedures Procedures (including critical Alvarez time)  Medications Ordered in UC Medications - No data to display  Initial Impression / Assessment and Plan / UC Course  I have reviewed the triage vital signs and the nursing notes.  Pertinent labs & imaging results that were available during my Alvarez of the patient were reviewed by me and considered in my medical decision making (see chart for details).     Discussed this is likely gout.  Will treat accordingly. Final Clinical Impressions(s) / UC Diagnoses   Final diagnoses:  Acute gout involving toe of right foot, unspecified cause     Discharge Instructions      Take the Medrol  Dosepak as directed Take all of day 1 today as a single dose when you get the prescription filled (loading dose of 6 pills) After this take it on schedule Walking while foot is painful Return as needed   ED Prescriptions     Medication Sig Dispense Auth. Provider   methylPREDNISolone  (MEDROL  DOSEPAK) 4 MG TBPK tablet tad 21 tablet Maranda Jamee Jacob, MD      PDMP not reviewed this encounter.    [1]  Social History Tobacco Use   Smoking status: Never   Smokeless tobacco: Never  Substance Use Topics   Alcohol use: No   Drug use: No     Maranda Jamee Jacob, MD 09/09/24 1120

## 2024-11-24 ENCOUNTER — Encounter: Admitting: Urgent Care
